# Patient Record
Sex: Male | Born: 1975 | Race: White | Hispanic: No | Marital: Single | Smoking: Current every day smoker
Health system: Southern US, Community
[De-identification: ages and names within clinical notes are randomized; demographics above are authoritative.]

---

## 2006-11-19 ENCOUNTER — Emergency Department: Payer: Self-pay | Admitting: Emergency Medicine

## 2012-04-02 ENCOUNTER — Emergency Department: Payer: Self-pay | Admitting: Emergency Medicine

## 2012-04-02 LAB — CBC WITH DIFFERENTIAL/PLATELET
Basophil #: 0 10*3/uL (ref 0.0–0.1)
Basophil %: 0.2 %
MCH: 31 pg (ref 26.0–34.0)
MCHC: 33.9 g/dL (ref 32.0–36.0)
MCV: 91 fL (ref 80–100)
Monocyte #: 1.2 x10 3/mm — ABNORMAL HIGH (ref 0.2–1.0)
Neutrophil %: 78.3 %
Platelet: 163 10*3/uL (ref 150–440)
RBC: 5.08 10*6/uL (ref 4.40–5.90)
RDW: 13.2 % (ref 11.5–14.5)

## 2012-04-02 LAB — BASIC METABOLIC PANEL
BUN: 12 mg/dL (ref 7–18)
Calcium, Total: 8.8 mg/dL (ref 8.5–10.1)
Chloride: 105 mmol/L (ref 98–107)
Co2: 27 mmol/L (ref 21–32)
Creatinine: 1.03 mg/dL (ref 0.60–1.30)
EGFR (African American): >60
Potassium: 3.8 mmol/L (ref 3.5–5.1)
Sodium: 138 mmol/L (ref 136–145)

## 2013-09-30 ENCOUNTER — Emergency Department: Payer: Self-pay | Admitting: Emergency Medicine

## 2015-03-26 ENCOUNTER — Emergency Department
Admission: EM | Admit: 2015-03-26 | Discharge: 2015-03-26 | Disposition: A | Discharge status: Home / Self Care | Payer: Self-pay | Attending: Emergency Medicine | Admitting: Emergency Medicine

## 2015-03-26 ENCOUNTER — Encounter: Payer: Self-pay | Admitting: Emergency Medicine

## 2015-03-26 DIAGNOSIS — M5442 Lumbago with sciatica, left side: Secondary | ICD-10-CM | POA: Insufficient documentation

## 2015-03-26 DIAGNOSIS — Z720 Tobacco use: Secondary | ICD-10-CM | POA: Insufficient documentation

## 2015-03-26 MED ORDER — PREDNISONE 20 MG PO TABS: 40.0000 mg | ORAL_TABLET | Freq: Every day | ORAL | Status: AC

## 2015-03-26 NOTE — ED Notes (Signed)
Pt c/o left lower back pain that radiates into the hip for the past 2 days

## 2015-03-26 NOTE — ED Notes (Signed)
Left lower back pain which is moving into left hip area  Ambulates with slight limp

## 2015-03-26 NOTE — Discharge Instructions (Signed)
Back Pain, Adult °Back pain is very common. The pain often gets better over time. The cause of back pain is usually not dangerous. Most people can learn to manage their back pain on their own.  °HOME CARE  °· Stay active. Start with short walks on flat ground if you can. Try to walk farther each day. °· Do not sit, drive, or stand in one place for more than 30 minutes. Do not stay in bed. °· Do not avoid exercise or work. Activity can help your back heal faster. °· Be careful when you bend or lift an object. Bend at your knees, keep the object close to you, and do not twist. °· Sleep on a firm mattress. Lie on your side, and bend your knees. If you lie on your back, put a pillow under your knees. °· Only take medicines as told by your doctor. °· Put ice on the injured area. °¨ Put ice in a plastic bag. °¨ Place a towel between your skin and the bag. °¨ Leave the ice on for 15-20 minutes, 03-04 times a day for the first 2 to 3 days. After that, you can switch between ice and heat packs. °· Ask your doctor about back exercises or massage. °· Avoid feeling anxious or stressed. Find good ways to deal with stress, such as exercise. °GET HELP RIGHT AWAY IF:  °· Your pain does not go away with rest or medicine. °· Your pain does not go away in 1 week. °· You have new problems. °· You do not feel well. °· The pain spreads into your legs. °· You cannot control when you poop (bowel movement) or pee (urinate). °· Your arms or legs feel weak or lose feeling (numbness). °· You feel sick to your stomach (nauseous) or throw up (vomit). °· You have belly (abdominal) pain. °· You feel like you may pass out (faint). °MAKE SURE YOU:  °· Understand these instructions. °· Will watch your condition. °· Will get help right away if you are not doing well or get worse. °Document Released: 02/14/2008 Document Revised: 11/20/2011 Document Reviewed: 12/30/2013 °ExitCare® Patient Information ©2015 [employer]. This information is not intended  to replace advice given to you by your health care provider. Make sure you discuss any questions you have with your health care provider. ° °

## 2015-03-26 NOTE — ED Provider Notes (Signed)
[facility] Emergency Department Provider Note  ____________________________________________  Time seen: On arrival  I have reviewed the triage vital signs and the nursing notes.   HISTORY  Chief Complaint Back Pain    HPI Ricky RenoRobert Terre Townsel is a 39 y.o. male who presentswith complaints of left lower back pain that has radiated into his left buttock for the past 2 days. He believes this may be related after to lifting something heavy. He denies any drug abuse. No fevers no chills. No focal weakness. No difficulty urinating. He reports the pain is aching and is mild to moderate depending on how he is positioned.    History reviewed. No pertinent past medical history.  There are no active problems to display for this patient.   No past surgical history on file.  Current Outpatient Rx  Name  Route  Sig  Dispense  Refill  . predniSONE (DELTASONE) 20 MG tablet   Oral   Take 2 tablets (40 mg total) by mouth daily.   8 tablet   0     Allergies Review of patient's allergies indicates no known allergies.  No family history on file.  Social History History  Substance Use Topics  . Smoking status: Current Every Day Smoker  . Smokeless tobacco: Not on file  . Alcohol Use: No    Review of Systems  Constitutional: Negative for fever. Eyes: Negative for visual changes. ENT: Negative for sore throat   Genitourinary: Negative for dysuria. Musculoskeletal: Negative for back pain. Skin: Negative for rash. Neurological: Negative for headaches or focal weakness   ____________________________________________   PHYSICAL EXAM:  VITAL SIGNS: ED Triage Vitals  Enc Vitals Group     BP 03/26/15 0942 117/68 mmHg     Pulse Rate 03/26/15 0942 61     Resp 03/26/15 0942 18     Temp 03/26/15 0942 98 F (36.7 C)     Temp Source 03/26/15 0942 Oral     SpO2 03/26/15 0942 98 %     Weight 03/26/15 0942 175 lb (79.379 kg)     Height 03/26/15 0942 5\' 10"   (1.778 m)     Head Cir --      Peak Flow --      Pain Score 03/26/15 0942 5     Pain Loc --      Pain Edu? --      Excl. in GC? --      Constitutional: Alert and oriented. Well appearing and in no distress. Eyes: Conjunctivae are normal.  ENT   Head: Normocephalic and atraumatic.   Mouth/Throat: Mucous membranes are moist. Cardiovascular: Normal rate, regular rhythm.  Respiratory: Normal respiratory effort without tachypnea nor retractions.  Gastrointestinal: Soft and non-tender in all quadrants. No distention. There is no CVA tenderness. Musculoskeletal: Nontender with normal range of motion in all extremities. No vertebral tenderness to palpation of the back. Possible muscle spasm left paraspinal inferior back. No saddle anesthesia Neurologic:  Normal speech and language. No gross focal neurologic deficits are appreciated. Skin:  Skin is warm, dry and intact. No rash noted. Psychiatric: Mood and affect are normal. Patient exhibits appropriate insight and judgment.  ____________________________________________    LABS (pertinent positives/negatives)  Labs Reviewed - No data to display  ____________________________________________     ____________________________________________    RADIOLOGY I have personally reviewed any xrays that were ordered on this patient: None  ____________________________________________   PROCEDURES  Procedure(s) performed: none   ____________________________________________   INITIAL IMPRESSION / ASSESSMENT  AND PLAN / ED COURSE  Pertinent labs & imaging results that were available during my care of the patient were reviewed by me and considered in my medical decision making (see chart for details).  History of present illness an exam most consistent with muscle spasm possibly causing mild sciatic type symptoms. We'll treat with NSAIDs and brief course of steroid's PCP  follow-up.  ____________________________________________   FINAL CLINICAL IMPRESSION(S) / ED DIAGNOSES  Final diagnoses:  Left-sided low back pain with left-sided sciatica     Jene Every, MD 03/26/15 1127

## 2016-05-24 ENCOUNTER — Emergency Department
Admission: EM | Admit: 2016-05-24 | Discharge: 2016-05-24 | Disposition: A | Discharge status: Home / Self Care | Payer: Self-pay | Attending: Emergency Medicine | Admitting: Emergency Medicine

## 2016-05-24 DIAGNOSIS — R55 Syncope and collapse: Secondary | ICD-10-CM | POA: Insufficient documentation

## 2016-05-24 DIAGNOSIS — R112 Nausea with vomiting, unspecified: Secondary | ICD-10-CM | POA: Insufficient documentation

## 2016-05-24 DIAGNOSIS — J069 Acute upper respiratory infection, unspecified: Secondary | ICD-10-CM | POA: Insufficient documentation

## 2016-05-24 DIAGNOSIS — F17200 Nicotine dependence, unspecified, uncomplicated: Secondary | ICD-10-CM | POA: Insufficient documentation

## 2016-05-24 LAB — URINALYSIS COMPLETE WITH MICROSCOPIC (ARMC ONLY)
BACTERIA UA: NONE SEEN
Bilirubin Urine: NEGATIVE
GLUCOSE, UA: NEGATIVE mg/dL
Ketones, ur: NEGATIVE mg/dL
LEUKOCYTES UA: NEGATIVE
NITRITE: NEGATIVE
PROTEIN: 30 mg/dL — AB
SPECIFIC GRAVITY, URINE: 1.032 — AB (ref 1.005–1.030)
SQUAMOUS EPITHELIAL / LPF: NONE SEEN
pH: 5 (ref 5.0–8.0)

## 2016-05-24 LAB — BASIC METABOLIC PANEL
ANION GAP: 10 (ref 5–15)
BUN: 14 mg/dL (ref 6–20)
CHLORIDE: 105 mmol/L (ref 101–111)
CO2: 24 mmol/L (ref 22–32)
Calcium: 9.7 mg/dL (ref 8.9–10.3)
Creatinine, Ser: 0.96 mg/dL (ref 0.61–1.24)
GFR calc Af Amer: >60 mL/min (ref >60)
GFR calc non Af Amer: >60 mL/min (ref >60)
GLUCOSE: 121 mg/dL — AB (ref 65–99)
POTASSIUM: 3.6 mmol/L (ref 3.5–5.1)
Sodium: 139 mmol/L (ref 135–145)

## 2016-05-24 LAB — CBC
HEMATOCRIT: 49.5 % (ref 40.0–52.0)
HEMOGLOBIN: 17.4 g/dL (ref 13.0–18.0)
MCH: 31.7 pg (ref 26.0–34.0)
MCHC: 35.2 g/dL (ref 32.0–36.0)
MCV: 90 fL (ref 80.0–100.0)
Platelets: 248 10*3/uL (ref 150–440)
RBC: 5.5 MIL/uL (ref 4.40–5.90)
RDW: 13 % (ref 11.5–14.5)
WBC: 12.4 10*3/uL — ABNORMAL HIGH (ref 3.8–10.6)

## 2016-05-24 MED ORDER — METOCLOPRAMIDE HCL 10 MG PO TABS: 10.0000 mg | ORAL_TABLET | Freq: Four times a day (QID) | ORAL | 0 refills | Status: AC | PRN

## 2016-05-24 NOTE — ED Triage Notes (Signed)
Pt states he passed out after taking a hot shower yesterday .Marland Kitchen. Denies injury from fall or pain.Marland Kitchen. t c/o dizziness and nausea since.. Pt is in NAD on arrival..

## 2016-05-24 NOTE — ED Provider Notes (Signed)
[facility] Emergency Department Provider Note  ____________________________________________   First MD Initiated Contact with Patient 05/24/16 1406     (approximate)  I have reviewed the triage vital signs and the nursing notes.   HISTORY  Chief Complaint Loss of Consciousness   HPI Ricky RenoRobert Terre Christensen is a 40 y.o. male without any chronic medical conditions was presenting to the emergency department today after syncopal episode yesterday. He says that he was feeling ill this past Saturday with a chest cold and cough. He said then yesterday he took a very hot shower and after getting out of the shower he started feeling lightheaded and then found himself on the floor of his bathroom. He said that he vomited afterwards. He denies any chest pain. Denies any shortness of breath. York SpanielSaid that he has been feeling nauseous since but without any further episodes of vomiting. However, he says that he is feeling improved at this time without any nausea or lightheadedness and feels like he wants to eat. He said that he came to the emergency department earlier today because while standing up at work he began feeling lightheaded again but did not pass out. He says that he has not had any episodes like this ever in the past. No known history of cardiac disease in the family.   History reviewed. No pertinent past medical history.  There are no active problems to display for this patient.   History reviewed. No pertinent surgical history.  Prior to Admission medications   Not on File    Allergies Review of patient's allergies indicates no known allergies.  No family history on file.  Social History Social History  Substance Use Topics  . Smoking status: Current Every Day Smoker  . Smokeless tobacco: Never Used  . Alcohol use No    Review of Systems Constitutional: No fever/chills Eyes: No visual changes. ENT: No sore throat. Cardiovascular: Denies chest  pain. Respiratory: Denies shortness of breath. Gastrointestinal: No abdominal pain. No diarrhea.  No constipation. Genitourinary: Negative for dysuria. Musculoskeletal: Negative for back pain. Skin: Negative for rash. Neurological: Negative for headaches, focal weakness or numbness.  10-point ROS otherwise negative.  ____________________________________________   PHYSICAL EXAM:  VITAL SIGNS: ED Triage Vitals  Enc Vitals Group     BP 05/24/16 1249 120/64     Pulse Rate 05/24/16 1249 67     Resp 05/24/16 1249 17     Temp 05/24/16 1249 98 F (36.7 C)     Temp Source 05/24/16 1249 Oral     SpO2 05/24/16 1249 95 %     Weight 05/24/16 1250 180 lb (81.6 kg)     Height 05/24/16 1250 5\' 10"  (1.778 m)     Head Circumference --      Peak Flow --      Pain Score --      Pain Loc --      Pain Edu? --      Excl. in GC? --     Constitutional: Alert and oriented. Well appearing and in no acute distress. Eyes: Conjunctivae are normal. PERRL. EOMI. Head: Atraumatic. Nose: No congestion/rhinnorhea. Mouth/Throat: Mucous membranes are moist.   Neck: No stridor.   Cardiovascular: Normal rate, regular rhythm. Grossly normal heart sounds.   Respiratory: Normal respiratory effort.  No retractions. Lungs CTAB. Gastrointestinal: Soft and nontender. No distention. Musculoskeletal: No lower extremity tenderness nor edema.  No joint effusions. Neurologic:  Normal speech and language. No gross focal neurologic deficits are appreciated.  No gait instability. Skin:  Skin is warm, dry and intact. No rash noted. Psychiatric: Mood and affect are normal. Speech and behavior are normal.  ____________________________________________   LABS (all labs ordered are listed, but only abnormal results are displayed)  Labs Reviewed  BASIC METABOLIC PANEL - Abnormal; Notable for the following:       Result Value   Glucose, Bld 121 (*)    All other components within normal limits  CBC - Abnormal; Notable  for the following:    WBC 12.4 (*)    All other components within normal limits  URINALYSIS COMPLETEWITH MICROSCOPIC ([facility] ONLY) - Abnormal; Notable for the following:    Color, Urine AMBER (*)    APPearance CLEAR (*)    Specific Gravity, Urine 1.032 (*)    Hgb urine dipstick 1+ (*)    Protein, ur 30 (*)    All other components within normal limits  CBG MONITORING, ED   ____________________________________________  EKG  ED ECG REPORT I, Arelia Longest, the attending physician, personally viewed and interpreted this ECG.   Date: 05/24/2016  EKG Time: 1309  Rate: 63  Rhythm: normal sinus rhythm with sinus arrhythmia.  Axis: Normal  Intervals:none  ST&T Change: Concave ST elevations in V3 through V5 which are minimal. Consistent with benign early repolarization.  ____________________________________________  RADIOLOGY   ____________________________________________   PROCEDURES  Procedure(s) performed:   Procedures  Critical Care performed:   ____________________________________________   INITIAL IMPRESSION / ASSESSMENT AND PLAN / ED COURSE  Pertinent labs & imaging results that were available during my care of the patient were reviewed by me and considered in my medical decision making (see chart for details).  Patient with what sounds like a viral illness with episode of vasovagal syncope in the near syncope today. He says that he is feeling improved. Denied any chest pain. Very reassuring EKG as well as lab workup. Patient says that he does smoke cigarettes as well as marijuana. However, he does not have any definite family history of heart disease.  I advised him now that he is feeling better to make sure drink plenty of fluids as well as to advance his diet as tolerated. He is understanding of this plan and willing to comply. Likely vasovagal episode. Not eating and drinking as normal. No chest pain. Episode of passing out happened after getting out of a hot  shower and was also associated with vomiting afterwards. Also with episode today without chest pain or shortness of breath while standing at work.  Clinical Course     ____________________________________________   FINAL CLINICAL IMPRESSION(S) / ED DIAGNOSES  Nausea and vomiting. Upper respiratory infection. Syncope.    NEW MEDICATIONS STARTED DURING THIS VISIT:  New Prescriptions   No medications on file     Note:  This document was prepared using Dragon voice recognition software and may include unintentional dictation errors.    Myrna Blazer, MD 05/24/16 [phone]

## 2017-06-12 ENCOUNTER — Encounter: Payer: Self-pay | Admitting: Medical Oncology

## 2017-06-12 ENCOUNTER — Emergency Department
Admission: EM | Admit: 2017-06-12 | Discharge: 2017-06-12 | Disposition: A | Discharge status: Home / Self Care | Payer: Self-pay | Attending: Emergency Medicine | Admitting: Emergency Medicine

## 2017-06-12 ENCOUNTER — Emergency Department: Payer: Self-pay

## 2017-06-12 DIAGNOSIS — F17200 Nicotine dependence, unspecified, uncomplicated: Secondary | ICD-10-CM | POA: Insufficient documentation

## 2017-06-12 DIAGNOSIS — M5442 Lumbago with sciatica, left side: Secondary | ICD-10-CM | POA: Insufficient documentation

## 2017-06-12 MED ORDER — KETOROLAC TROMETHAMINE 30 MG/ML IJ SOLN
30.0000 mg | Freq: Once | INTRAMUSCULAR | Status: AC
  Administered 2017-06-12: 30 mg via INTRAMUSCULAR
  Filled 2017-06-12: qty 1

## 2017-06-12 MED ORDER — PREDNISONE 50 MG PO TABS: ORAL_TABLET | ORAL | 0 refills | Status: AC

## 2017-06-12 MED ORDER — CYCLOBENZAPRINE HCL 5 MG PO TABS: 5.0000 mg | ORAL_TABLET | Freq: Three times a day (TID) | ORAL | 0 refills | Status: AC | PRN

## 2017-06-12 MED ORDER — ORPHENADRINE CITRATE 30 MG/ML IJ SOLN
60.0000 mg | Freq: Two times a day (BID) | INTRAMUSCULAR | Status: DC
  Administered 2017-06-12: 60 mg via INTRAMUSCULAR
  Filled 2017-06-12: qty 2

## 2017-06-12 NOTE — ED Triage Notes (Signed)
Pt reports he was trying to pull up a stump and injured lower back.

## 2017-06-12 NOTE — ED Provider Notes (Signed)
[facility] Emergency Department Provider Note  ____________________________________________  Time seen: Approximately 5:59 PM  I have reviewed the triage vital signs and the nursing notes.   HISTORY  Chief Complaint Back Pain    HPI Rhiley Solem is a 41 y.o. male presenting to the emergency department with low back pain which radiates into left buttocks after pulling a stump earlier today. Patient has been ambulating. He denies weakness or changes in sensation of the lower extremities. He denies falls. Patient states that he had a flare of sciatica approximately one year ago. No bowel or bladder incontinence. No alleviating measures have been attempted.   History reviewed. No pertinent past medical history.  There are no active problems to display for this patient.   No past surgical history on file.  Prior to Admission medications   Medication Sig Start Date End Date Taking? Authorizing Provider  cyclobenzaprine (FLEXERIL) 5 MG tablet Take 1 tablet (5 mg total) by mouth 3 (three) times daily as needed for muscle spasms. 06/12/17 06/15/17  Orvil Feil, PA-C  metoCLOPramide (REGLAN) 10 MG tablet Take 1 tablet (10 mg total) by mouth every 6 (six) hours as needed. 05/24/16   Schaevitz, Myra Rude, MD  predniSONE (DELTASONE) 50 MG tablet Take one tablet by mouth daily for the next five days. 06/12/17   Orvil Feil, PA-C    Allergies Patient has no known allergies.  No family history on file.  Social History Social History  Substance Use Topics  . Smoking status: Current Every Day Smoker  . Smokeless tobacco: Never Used  . Alcohol use No     Review of Systems  Constitutional: No fever/chills Eyes: No visual changes. No discharge ENT: No upper respiratory complaints. Cardiovascular: no chest pain. Respiratory: no cough. No SOB. Musculoskeletal: Patient has low back pain with left lower extremity radiculopathy.  Skin: Negative for  rash, abrasions, lacerations, ecchymosis. Neurological: Negative for headaches, focal weakness or numbness.  ____________________________________________   PHYSICAL EXAM:  VITAL SIGNS: ED Triage Vitals  Enc Vitals Group     BP 06/12/17 1657 122/71     Pulse Rate 06/12/17 1657 74     Resp 06/12/17 1657 18     Temp 06/12/17 1657 98 F (36.7 C)     Temp Source 06/12/17 1657 Oral     SpO2 06/12/17 1657 98 %     Weight 06/12/17 1658 160 lb (72.6 kg)     Height 06/12/17 1658  (1.778 m)     Head Circumference --      Peak Flow --      Pain Score 06/12/17 1657 7     Pain Loc --      Pain Edu? --      Excl. in GC? --      Constitutional: Alert and oriented. Patient is talkative and engaged.  Eyes: Palpebral and bulbar conjunctiva are nonerythematous bilaterally. PERRL. EOMI.  Head: Atraumatic. ENT:      Ears: Tympanic membranes are pearly bilaterally without bloody effusion visualized.       Nose: Nasal septum is midline without evidence of blood or septal hematoma.      Mouth/Throat: Mucous membranes are moist. Uvula is midline. Neck: Full range of motion. No pain with neck flexion. No pain with palpation of the cervical spine.  Cardiovascular: No pain with palpation over the anterior and posterior chest wall. Normal rate, regular rhythm. Normal S1 and S2. No murmurs, gallops or rubs auscultated.  Respiratory:  Resonant and symmetric percussion tones bilaterally. On auscultation, adventitious sounds are absent.  Musculoskeletal: Patient has 5/5 strength in the upper and lower extremities bilaterally. Full range of motion at the shoulder, elbow and wrist bilaterally. Full range of motion at the hip, knee and ankle bilaterally. No changes in gait. Palpable radial, ulnar and dorsalis pedis pulses bilaterally and symmetrically. Neurologic: Normal speech and language. No gross focal neurologic deficits are appreciated. Cranial nerves: 2-10 normal as tested. Cerebellar:  Finger-nose-finger WNL, heel to shin WNL. Sensorimotor: No sensory loss or abnormal reflexes. Vision: No visual field deficts noted to confrontation.  Speech: No dysarthria or expressive aphasia.  Skin:  Skin is warm, dry and intact. No rash or bruising noted.  Psychiatric: Mood and affect are normal for age. Speech and behavior are normal.     ____________________________________________   LABS (all labs ordered are listed, but only abnormal results are displayed)  Labs Reviewed - No data to display ____________________________________________  EKG   ____________________________________________  RADIOLOGY Geraldo Pitter, personally viewed and evaluated these images (plain radiographs) as part of my medical decision making, as well as reviewing the written report by the radiologist.  Dg Lumbar Spine 2-3 Views  Result Date: 06/12/2017 CLINICAL DATA:  Low back pain EXAM: LUMBAR SPINE - 2-3 VIEW COMPARISON:  None. FINDINGS: Lumbar alignment is within normal limits. Mild degenerative changes at L4-L5. Remaining disc spaces are maintained. IMPRESSION: Mild degenerative changes.  No acute osseous abnormality. Electronically Signed   By: Jasmine Pang M.D.   On: 06/12/2017 17:38    ____________________________________________    PROCEDURES  Procedure(s) performed:    Procedures    Medications  ketorolac (TORADOL) 30 MG/ML injection 30 mg (not administered)  orphenadrine (NORFLEX) injection 60 mg (not administered)     ____________________________________________   INITIAL IMPRESSION / ASSESSMENT AND PLAN / ED COURSE  Pertinent labs & imaging results that were available during my care of the patient were reviewed by me and considered in my medical decision making (see chart for details).  Review of the [place] CSRS was performed in accordance of the [facility] prior to dispensing any controlled drugs.     Assessment and plan Low back pain with left lower extremity  radiculopathy Patient presents to the emergency department with low back pain with left lower extremity radiculopathy. Neurologic exam and overall physical exam is reassuring. X-Paradiso examination reveals no acute fractures or bony abnormalities. Patient was given Toradol and Norflex in the emergency department. He was discharged with prednisone and Flexeril. Patient was advised to start prednisone if low back pain becomes refractory to anti-inflammatories. Patient voiced understanding regarding this recommendation. Patient was advised to follow up primary care as needed. A work note was provided.   ____________________________________________  FINAL CLINICAL IMPRESSION(S) / ED DIAGNOSES  Final diagnoses:  Acute bilateral low back pain with left-sided sciatica      NEW MEDICATIONS STARTED DURING THIS VISIT:  New Prescriptions   CYCLOBENZAPRINE (FLEXERIL) 5 MG TABLET    Take 1 tablet (5 mg total) by mouth 3 (three) times daily as needed for muscle spasms.   PREDNISONE (DELTASONE) 50 MG TABLET    Take one tablet by mouth daily for the next five days.        This chart was dictated using voice recognition software/Dragon. Despite best efforts to proofread, errors can occur which can change the meaning. Any change was purely unintentional.    Orvil Feil, PA-C 06/12/17 Orland Penman, MD  06/12/17 2031  

## 2017-08-08 ENCOUNTER — Encounter: Payer: Self-pay | Admitting: Emergency Medicine

## 2017-08-08 ENCOUNTER — Emergency Department: Payer: Self-pay

## 2017-08-08 ENCOUNTER — Emergency Department
Admission: EM | Admit: 2017-08-08 | Discharge: 2017-08-08 | Disposition: A | Discharge status: Home / Self Care | Payer: Self-pay | Attending: Emergency Medicine | Admitting: Emergency Medicine

## 2017-08-08 DIAGNOSIS — W2189XA Striking against or struck by other sports equipment, initial encounter: Secondary | ICD-10-CM | POA: Insufficient documentation

## 2017-08-08 DIAGNOSIS — S0990XA Unspecified injury of head, initial encounter: Secondary | ICD-10-CM | POA: Insufficient documentation

## 2017-08-08 DIAGNOSIS — Y93H2 Activity, gardening and landscaping: Secondary | ICD-10-CM | POA: Insufficient documentation

## 2017-08-08 DIAGNOSIS — Y92096 Garden or yard of other non-institutional residence as the place of occurrence of the external cause: Secondary | ICD-10-CM | POA: Insufficient documentation

## 2017-08-08 DIAGNOSIS — S63601A Unspecified sprain of right thumb, initial encounter: Secondary | ICD-10-CM | POA: Insufficient documentation

## 2017-08-08 DIAGNOSIS — Y999 Unspecified external cause status: Secondary | ICD-10-CM | POA: Insufficient documentation

## 2017-08-08 DIAGNOSIS — F17200 Nicotine dependence, unspecified, uncomplicated: Secondary | ICD-10-CM | POA: Insufficient documentation

## 2017-08-08 MED ORDER — MELOXICAM 15 MG PO TABS: 15.0000 mg | ORAL_TABLET | Freq: Every day | ORAL | 2 refills | Status: AC

## 2017-08-08 NOTE — Discharge Instructions (Signed)
Follow-up with your regular doctor or Dr.Krasinski if you are not better in 5-7 days, wear the splint to give you extra support, take over-the-counter ibuprofen as needed for pain and swelling, ice to the affected joint, return to the emergency department if you're worsening, monitor your symptoms for concussion, if you develop any nausea, vomiting, or change in vision please return immediately to the emergency department

## 2017-08-08 NOTE — ED Notes (Signed)
NAD noted at time of D/C. Pt denies questions or concerns. Pt ambulatory to the lobby at this time.   Pt states is not filing as worker's comp.

## 2017-08-08 NOTE — ED Triage Notes (Signed)
Patient presents to the ED with painful head and right thumb.  Patient states he was hit in the back of the head with a portable basketball goal and the rim of the goal hit his hand.  Patient states he was working for [employer] but does not want to [employer] workman's comp.  Patient is in no obvious distress at this time.

## 2017-08-08 NOTE — ED Provider Notes (Signed)
[facility] Emergency Department Provider Note  ____________________________________________   First MD Initiated Contact with Patient 08/08/17 1653     (approximate)  I have reviewed the triage vital signs and the nursing notes.   HISTORY  Chief Complaint Head Injury and Hand Injury    HPI Ricky RenoRobert Terre Fjeld is a 41 y.o. 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 No pertinent past medical history.  There are no active problems to display for this patient.   History reviewed. No pertinent surgical history.  Prior to Admission medications   Medication Sig Start Date End Date Taking? Authorizing Provider  meloxicam (MOBIC) 15 MG tablet Take 1 tablet (15 mg total) by mouth daily. 08/08/17 08/08/18  Fisher, Roselyn BeringSusan W, PA-C  metoCLOPramide (REGLAN) 10 MG tablet Take 1 tablet (10 mg total) by mouth every 6 (six) hours as needed. 05/24/16   Schaevitz, Myra Rudeavid Matthew, MD  predniSONE (DELTASONE) 50 MG tablet Take one tablet by mouth daily for the next five days. 06/12/17   Orvil FeilWoods, Jaclyn M, PA-C    Allergies Patient has no known allergies.  No family history on file.  Social History Social History   Tobacco Use  . Smoking status: Current Every Day Smoker  . Smokeless tobacco: Never Used  Substance Use Topics  . Alcohol use: No  . Drug use: Not on file    Review of Systems  Constitutional: No fever/chills Head: Positive for bump on his head Eyes: No visual changes. ENT: No sore throat. Respiratory: Denies cough Genitourinary: Negative for dysuria. Musculoskeletal: Negative for back pain. Positive right thumb pain Skin: Negative for  rash.    ____________________________________________   PHYSICAL EXAM:  VITAL SIGNS: ED Triage Vitals [08/08/17 1648]  Enc Vitals Group     BP 101/76     Pulse Rate 82     Resp 18     Temp 98.2 F (36.8 C)     Temp Source Oral     SpO2 97 %     Weight 160 lb (72.6 kg)     Height 5\' 10"  (1.778 m)     Head Circumference      Peak Flow      Pain Score 8     Pain Loc      Pain Edu?      Excl. in GC?     Constitutional: Alert and oriented. Well appearing and in no acute distress. Head: Is a for small knot at the top of his head, area is a little tender, no bruising is noted Eyes: Conjunctivae are normal. perrl Nose: No congestion/rhinnorhea. Mouth/Throat: Mucous membranes are moist.   Cardiovascular: Normal rate, regular rhythm. Respiratory: Normal respiratory effort.  No retractions GU: deferred Musculoskeletal: FROM all extremities, warm and well perfused, right thumb is tender at the base, small amount swelling noted, neurovascular is intact Neurologic:  Normal speech and language.  Skin:  Skin is warm, dry and intact. No rash noted. No bruising noted at the thumb, no bruising noted on the scalp Psychiatric: Mood and affect are normal. Speech and behavior are normal.  ____________________________________________   LABS (all labs ordered are listed, but only abnormal results are displayed)  Labs Reviewed -  No data to display ____________________________________________   ____________________________________________  RADIOLOGY  X-Snodgrass of the right thumb is negative  ____________________________________________   PROCEDURES  Procedure(s) performed: No      ____________________________________________   INITIAL IMPRESSION / ASSESSMENT AND PLAN / ED COURSE  Pertinent labs & imaging results that were available during my care of the patient were reviewed by me and considered in my medical decision making (see chart for details).  Patient is  41 year old male that injured his thumb when I portable basketball goal started to fall, did impact his head, he states he has not had any headaches or trouble with nausea or vomiting, states he is not worried about his head, states his right thumb hurts more than anything, on exam the right thumb is tender and a little swollen, x-Crevier of the right thumb is ordered    ----------------------------------------- 5:38 PM on 08/08/2017 -----------------------------------------  X-Brazil of the right thumb is negative, a Velcro thumb spica splint was ordered, patient was given a prescription for meloxicam 50 mg per day, is apply ice to the area, he is to follow-up with orthopedics if he is not better in 5-7 days, was also given head instructions, was instructed on how to identify a concussion, he is to return to the emergency department if he has any signs or symptoms of a concussion   ____________________________________________   FINAL CLINICAL IMPRESSION(S) / ED DIAGNOSES  Final diagnoses:  Sprain of right thumb, unspecified site of finger, initial encounter  Minor head injury, initial encounter      NEW MEDICATIONS STARTED DURING THIS VISIT:  This SmartLink is deprecated. Use AVSMEDLIST instead to display the medication list for a patient.   Note:  This document was prepared using Dragon voice recognition software and may include unintentional dictation errors.    Faythe GheeFisher, Susan W, PA-C 08/08/17 1746    Nita SickleVeronese, [place], MD 08/08/17 [phone]

## 2018-05-13 IMAGING — CR DG LUMBAR SPINE 2-3V
1 series · 3 of 3 positions shown · non-contrast
Comparison: None.

CLINICAL DATA: Low back pain

EXAM:
LUMBAR SPINE - 2-3 VIEW

[Series 1: dg lumbar spine 2-3 views · 0.14mm/px · 3 of 3 slices shown]
[im 1/3]
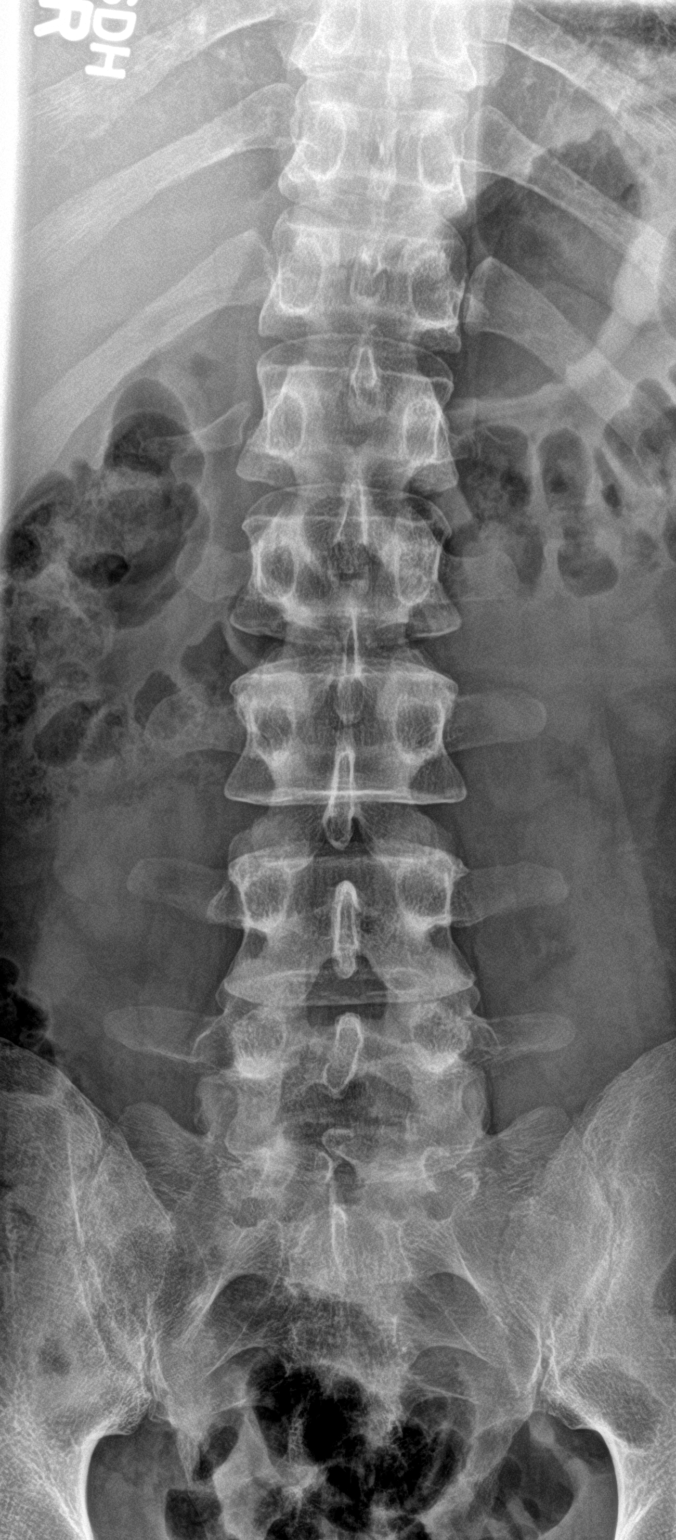
[im 2/3]
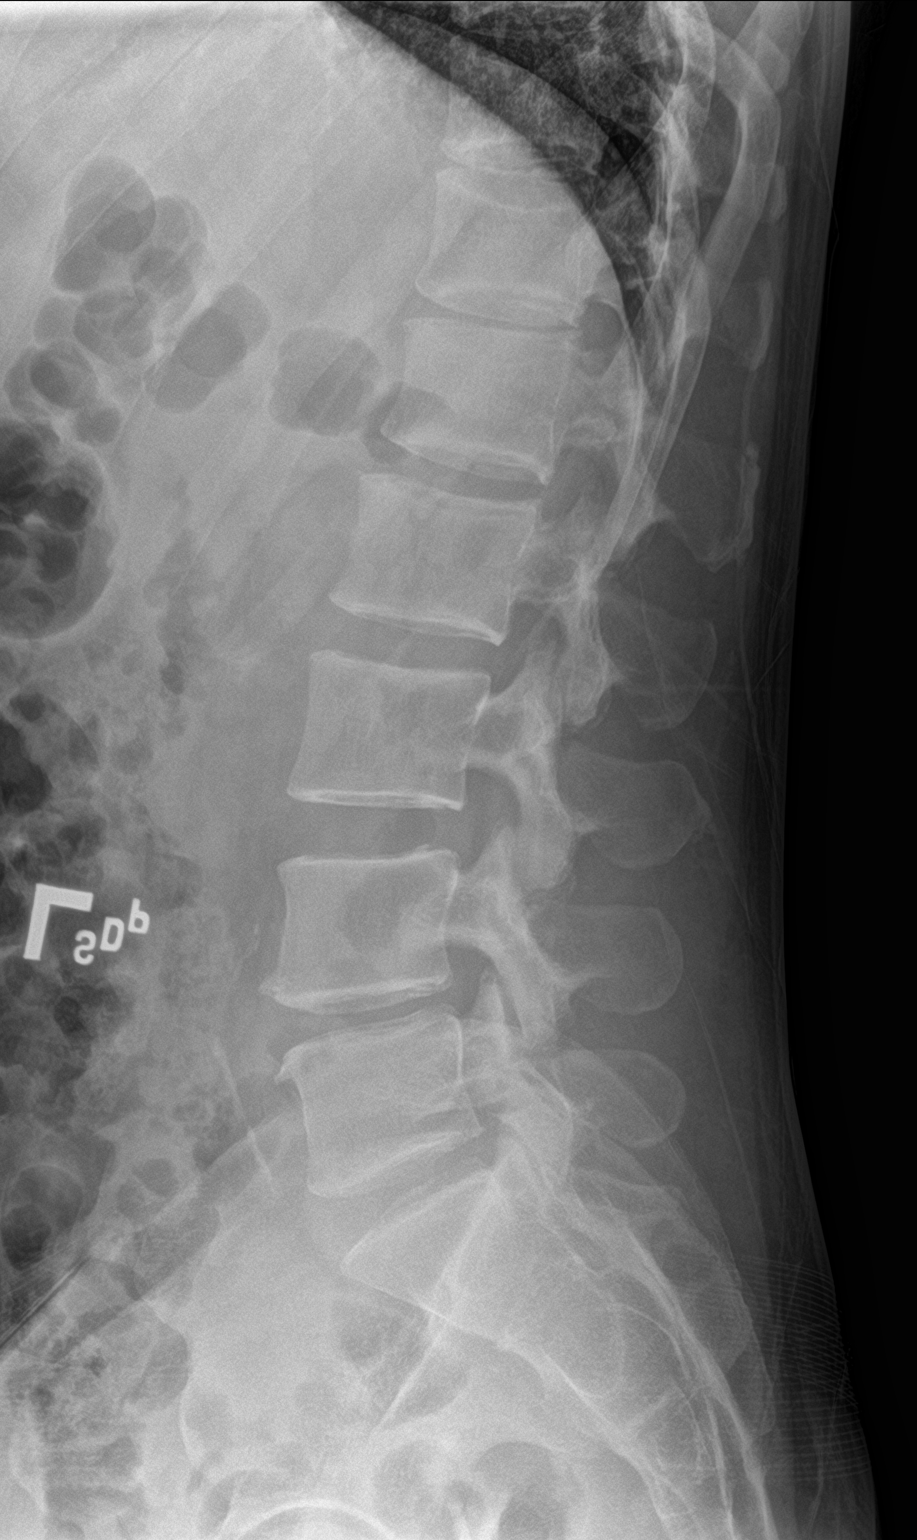
[im 3/3]
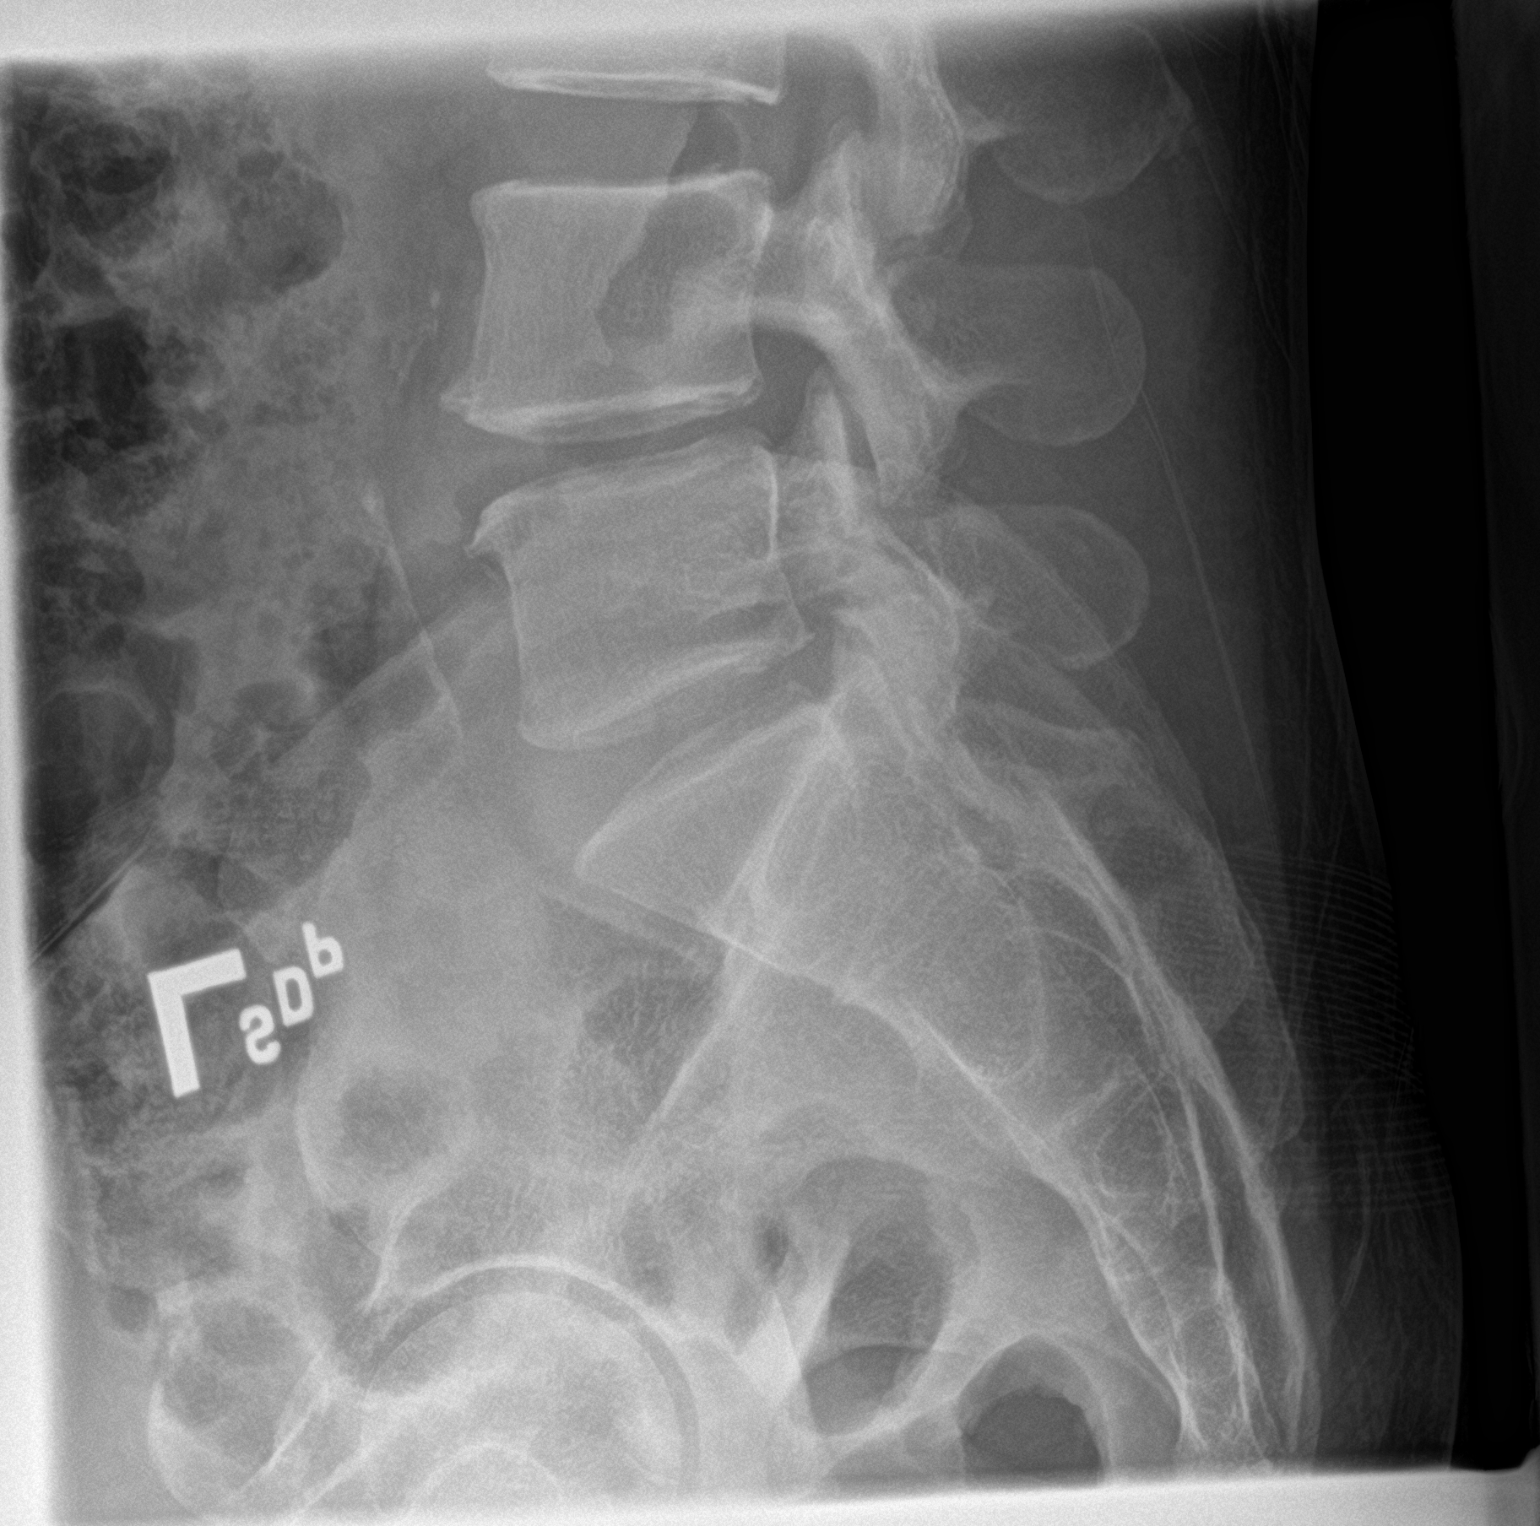

[3 of 3 positions shown; findings below may reference images not displayed]

FINDINGS: Lumbar alignment is within normal limits. Mild degenerative changes
at L4-L5. Remaining disc spaces are maintained.
IMPRESSION: Mild degenerative changes.  No acute osseous abnormality.

## 2018-07-09 IMAGING — DX DG FINGER THUMB 2+V*R*
3 series · 3 of 3 positions shown · non-contrast
Comparison: None

CLINICAL DATA: Basketball goal fell on RIGHT hand while mowing the
lawn today, pain and limited range of motion of thumb since, injury

EXAM:
RIGHT THUMB 2+V

[finger ap]
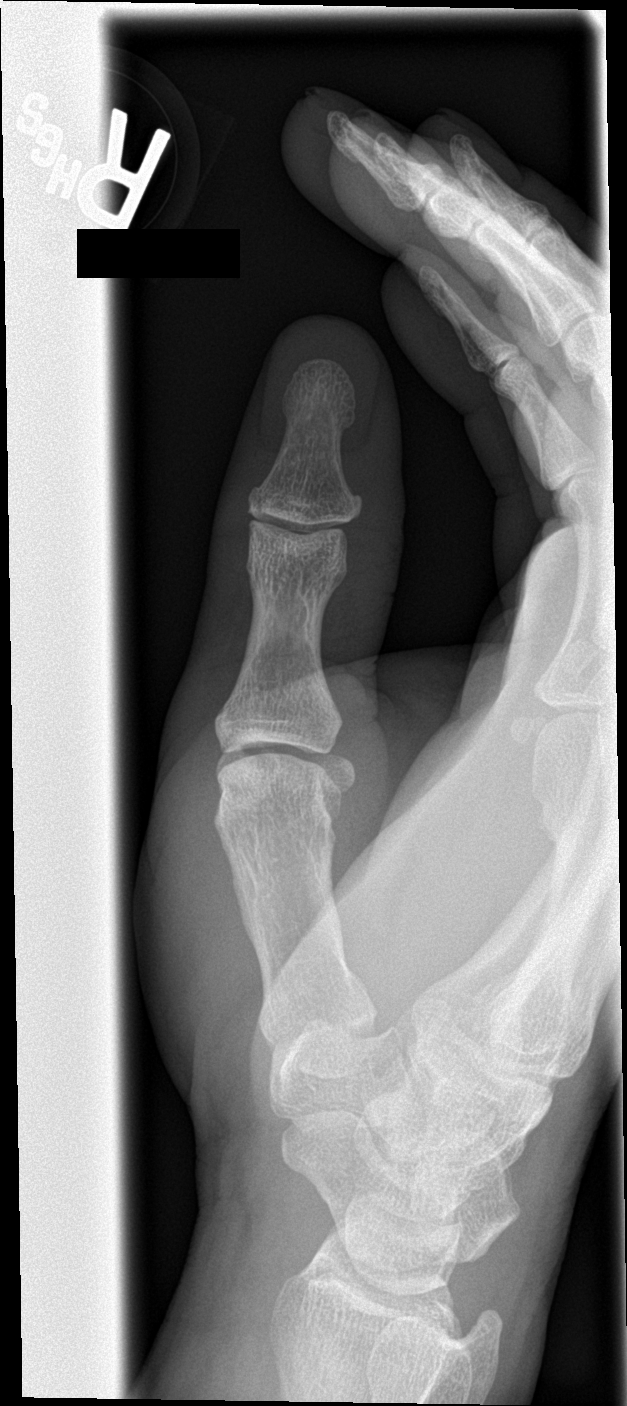

[finger obl]
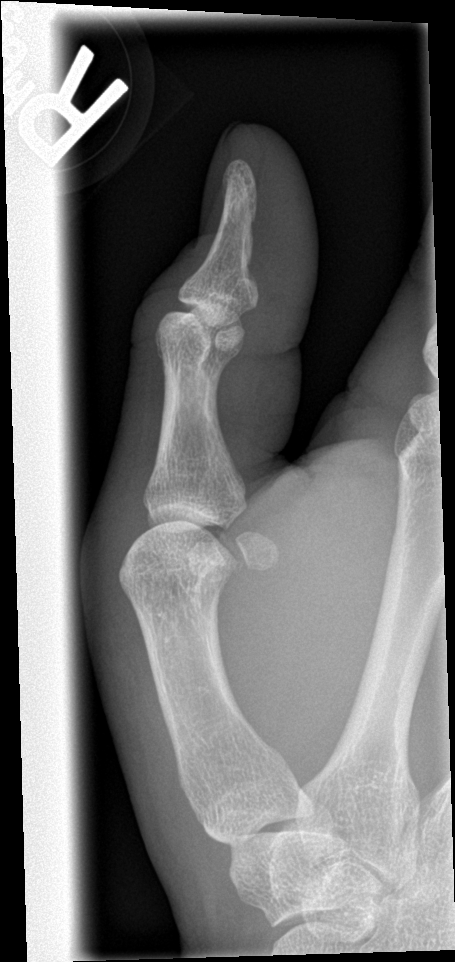

[finger lat]
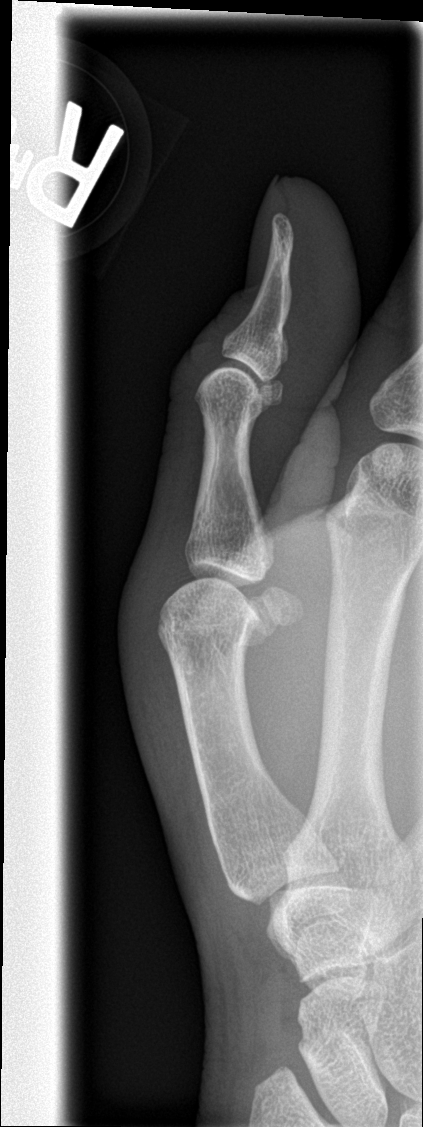

[3 of 3 positions shown; findings below may reference images not displayed]

FINDINGS: Osseous mineralization normal.

Joint spaces preserved.

No fracture, dislocation, or bone destruction.
IMPRESSION: Normal exam.
